# Patient Record
Sex: Male | Born: 1990 | Race: Black or African American | Hispanic: No | Marital: Single | Smoking: Never smoker
Health system: Southern US, Community
[De-identification: ages and names within clinical notes are randomized; demographics above are authoritative.]

## PROBLEM LIST (undated history)

## (undated) DIAGNOSIS — J45909 Unspecified asthma, uncomplicated: Secondary | ICD-10-CM

---

## 2014-09-22 ENCOUNTER — Ambulatory Visit: Payer: Self-pay

## 2015-08-16 ENCOUNTER — Emergency Department (HOSPITAL_COMMUNITY): Payer: Medicaid Other

## 2015-08-16 ENCOUNTER — Emergency Department (HOSPITAL_COMMUNITY)
Admission: EM | Admit: 2015-08-16 | Discharge: 2015-08-17 | Disposition: A | Discharge status: Home / Self Care | Payer: Medicaid Other | Attending: Emergency Medicine | Admitting: Emergency Medicine

## 2015-08-16 ENCOUNTER — Encounter (HOSPITAL_COMMUNITY): Payer: Self-pay | Admitting: *Deleted

## 2015-08-16 DIAGNOSIS — J45909 Unspecified asthma, uncomplicated: Secondary | ICD-10-CM | POA: Insufficient documentation

## 2015-08-16 DIAGNOSIS — M549 Dorsalgia, unspecified: Secondary | ICD-10-CM | POA: Diagnosis not present

## 2015-08-16 DIAGNOSIS — B029 Zoster without complications: Secondary | ICD-10-CM

## 2015-08-16 DIAGNOSIS — M25512 Pain in left shoulder: Secondary | ICD-10-CM | POA: Diagnosis not present

## 2015-08-16 DIAGNOSIS — R21 Rash and other nonspecific skin eruption: Secondary | ICD-10-CM | POA: Diagnosis present

## 2015-08-16 HISTORY — DX: Unspecified asthma, uncomplicated: J45.909

## 2015-08-16 MED ORDER — KETOROLAC TROMETHAMINE 60 MG/2ML IM SOLN
60.0000 mg | Freq: Once | INTRAMUSCULAR | Status: AC
  Administered 2015-08-16: 60 mg via INTRAMUSCULAR
  Filled 2015-08-16: qty 2

## 2015-08-16 MED ORDER — ACYCLOVIR 200 MG PO CAPS
800.0000 mg | ORAL_CAPSULE | Freq: Once | ORAL | Status: AC
  Administered 2015-08-16: 800 mg via ORAL
  Filled 2015-08-16: qty 4

## 2015-08-16 MED ORDER — HYDROCODONE-ACETAMINOPHEN 5-325 MG PO TABS
1.0000 | ORAL_TABLET | Freq: Once | ORAL | Status: AC
  Administered 2015-08-16: 1 via ORAL
  Filled 2015-08-16: qty 1

## 2015-08-16 MED ORDER — IBUPROFEN 600 MG PO TABS: 600.0000 mg | ORAL_TABLET | Freq: Four times a day (QID) | ORAL | Status: AC | PRN

## 2015-08-16 MED ORDER — ACYCLOVIR 800 MG PO TABS: 800.0000 mg | ORAL_TABLET | Freq: Every day | ORAL | Status: AC

## 2015-08-16 MED ORDER — HYDROCODONE-ACETAMINOPHEN 5-325 MG PO TABS: 2.0000 | ORAL_TABLET | ORAL | Status: AC | PRN

## 2015-08-16 NOTE — ED Notes (Signed)
Patient transported to X-ray 

## 2015-08-16 NOTE — ED Provider Notes (Signed)
CSN: [id number]     Arrival date & time 08/16/15  1740 History   First MD Initiated Contact with Patient 08/16/15 2214     Chief Complaint  Patient presents with  . Extremity Laceration     (Consider location/radiation/quality/duration/timing/severity/associated sxs/prior Treatment) HPI Patient presents with rash to the left upper chest and left upper back. This been gone for 3 days and then developed painful rash. No previously similar rash. No new exposures. States he had chickenpox as a child. Denies any shortness of breath, fever or chills. Patient does have some pain in his left hand. Describes the sensation as "bone feels cold". No contacts with similar illness. Past Medical History  Diagnosis Date  . Asthma    History reviewed. No pertinent past surgical history. No family history on file. Social History  Substance Use Topics  . Smoking status: Never Smoker   . Smokeless tobacco: None  . Alcohol Use: Yes    Review of Systems  Constitutional: Negative for fever and chills.  Eyes: Negative for visual disturbance.  Respiratory: Negative for shortness of breath.   Cardiovascular: Negative for chest pain, palpitations and leg swelling.  Gastrointestinal: Negative for nausea, vomiting, abdominal pain, diarrhea and constipation.  Musculoskeletal: Positive for back pain. Negative for neck pain and neck stiffness.  Skin: Positive for rash. Negative for wound.  Neurological: Negative for dizziness, weakness, light-headedness, numbness and headaches.  All other systems reviewed and are negative.     Allergies  Review of patient's allergies indicates no known allergies.  Home Medications   Prior to Admission medications   Medication Sig Start Date End Date Taking? Authorizing Provider  acyclovir (ZOVIRAX) 800 MG tablet Take 1 tablet (800 mg total) by mouth 5 (five) times daily. 08/16/15   Loren Raceravid Rogerick Baldwin, MD  HYDROcodone-acetaminophen (NORCO) 5-325 MG tablet Take 2 tablets  by mouth every 4 (four) hours as needed. 08/16/15   Loren Raceravid Hanan Moen, MD  ibuprofen (ADVIL,MOTRIN) 600 MG tablet Take 1 tablet (600 mg total) by mouth every 6 (six) hours as needed. 08/16/15   Loren Raceravid Taetum Flewellen, MD   BP 131/77 mmHg  Pulse 70  Temp(Src) 98 F (36.7 C)  Resp 18  Ht 5\' 9"  (1.753 m)  Wt 181 lb 1 oz (82.129 kg)  BMI 26.73 kg/m2  SpO2 100% Physical Exam  Constitutional: He is oriented to person, place, and time. He appears well-developed and well-nourished. No distress.  HENT:  Head: Normocephalic and atraumatic.  Mouth/Throat: Oropharynx is clear and moist. No oropharyngeal exudate.  Eyes: EOM are normal. Pupils are equal, round, and reactive to light.  Neck: Normal range of motion. Neck supple. No JVD present.  Cardiovascular: Normal rate and regular rhythm.  Exam reveals no gallop and no friction rub.   No murmur heard. Pulmonary/Chest: Effort normal and breath sounds normal. No stridor. No respiratory distress. He has no wheezes. He has no rales. He exhibits tenderness.  Patient does have mild tenderness in the middle of the left clavicle. There is bony prominence at this area.  Abdominal: Soft. Bowel sounds are normal. He exhibits no distension and no mass. There is no tenderness. There is no rebound and no guarding.  Musculoskeletal: Normal range of motion. He exhibits no edema or tenderness.  No midline thoracic or lumbar tenderness. Distal pulses intact.  Neurological: He is alert and oriented to person, place, and time.  5/5 motor in all extremities. Sensation fully intact.  Skin: Skin is warm and dry. Rash noted. There is erythema.  Patient  with edematous vesicular rash in band like pattern to the upper chest and left upper thoracic region not crossing midline. Tender to light palpation.   Psychiatric: He has a normal mood and affect. His behavior is normal.  Nursing note and vitals reviewed.   ED Course  Procedures (including critical care time) Labs  Review Labs Reviewed - No data to display  Imaging Review No results found. I have personally reviewed and evaluated these images and lab results as part of my medical decision-making.   EKG Interpretation None      MDM   Final diagnoses:  Herpes zoster  Clavicle pain, left    Patient with rash consistent zoster. We'll treat symptomatically and start on antivirals. Patient is well-appearing and in no distress. He has normal vital signs.    Loren Racer, MD 08/20/15 [id number]

## 2015-08-16 NOTE — Discharge Instructions (Signed)

## 2015-08-16 NOTE — ED Notes (Signed)
The pt is c/o lt hand pain  For 3 days  He is also c/o clavicle lt pain that he broke 2011 and he also has rash on his  Chest yesterday  That is just below the lt clavicle and he feels like it is on his posterior shoulder on the lt.   Pain  And itching.  The anterior chest appears to be a shingles rash

## 2015-12-26 ENCOUNTER — Encounter (HOSPITAL_BASED_OUTPATIENT_CLINIC_OR_DEPARTMENT_OTHER): Payer: Self-pay | Admitting: *Deleted

## 2015-12-26 ENCOUNTER — Emergency Department (HOSPITAL_BASED_OUTPATIENT_CLINIC_OR_DEPARTMENT_OTHER): Payer: Medicaid Other

## 2015-12-26 ENCOUNTER — Emergency Department (HOSPITAL_BASED_OUTPATIENT_CLINIC_OR_DEPARTMENT_OTHER)
Admission: EM | Admit: 2015-12-26 | Discharge: 2015-12-26 | Disposition: A | Discharge status: Home / Self Care | Payer: Medicaid Other | Attending: Emergency Medicine | Admitting: Emergency Medicine

## 2015-12-26 DIAGNOSIS — R05 Cough: Secondary | ICD-10-CM | POA: Diagnosis present

## 2015-12-26 DIAGNOSIS — J45909 Unspecified asthma, uncomplicated: Secondary | ICD-10-CM | POA: Diagnosis not present

## 2015-12-26 DIAGNOSIS — J209 Acute bronchitis, unspecified: Secondary | ICD-10-CM | POA: Insufficient documentation

## 2015-12-26 MED ORDER — ALBUTEROL SULFATE HFA 108 (90 BASE) MCG/ACT IN AERS
2.0000 | INHALATION_SPRAY | RESPIRATORY_TRACT | Status: DC | PRN
  Administered 2015-12-26: 2 via RESPIRATORY_TRACT
  Filled 2015-12-26: qty 6.7

## 2015-12-26 MED ORDER — AZITHROMYCIN 250 MG PO TABS: ORAL_TABLET | ORAL | Status: AC

## 2015-12-26 MED ORDER — PREDNISONE 10 MG PO TABS: 20.0000 mg | ORAL_TABLET | Freq: Two times a day (BID) | ORAL | Status: AC

## 2015-12-26 NOTE — ED Notes (Signed)
Cough x 2 months. Worse over the past 2 weeks. He ran out of his inhaler 2 months ago.

## 2015-12-26 NOTE — ED Provider Notes (Signed)
CSN: [id number]     Arrival date & time 12/26/15  1839 History  By signing my name below, I, Linus Galas, attest that this documentation has been prepared under the direction and in the presence of Geoffery Lyons, MD. Electronically Signed: Linus Galas, ED Scribe. 12/26/2015. 8:04 PM.   Chief Complaint  Patient presents with  . Cough   The history is provided by the patient. No language interpreter was used.   HPI Comments: Eric Jennings is a 25 y.o. male who presents to the Emergency Department with a PMHx of asthma complaining of an ongoing productive cough for 2 months that worsened over the past 2 weeks. Pt also reports diaphoresis. Pt states he ran out of his inhaler 2 months ago. Pt denies any fevers, chills, CP, N/V/D or any other symptoms at this time. Pt does not smoke cigarettes.  Past Medical History  Diagnosis Date  . Asthma    History reviewed. No pertinent past surgical history. No family history on file. Social History  Substance Use Topics  . Smoking status: Never Smoker   . Smokeless tobacco: None  . Alcohol Use: Yes    Review of Systems A complete 10 system review of systems was obtained and all systems are negative except as noted in the HPI and PMH.   Allergies  Review of patient's allergies indicates no known allergies.  Home Medications   Prior to Admission medications   Medication Sig Start Date End Date Taking? Authorizing Provider  acyclovir (ZOVIRAX) 800 MG tablet Take 1 tablet (800 mg total) by mouth 5 (five) times daily. 08/16/15   Loren Racer, MD  HYDROcodone-acetaminophen (NORCO) 5-325 MG tablet Take 2 tablets by mouth every 4 (four) hours as needed. 08/16/15   Loren Racer, MD  ibuprofen (ADVIL,MOTRIN) 600 MG tablet Take 1 tablet (600 mg total) by mouth every 6 (six) hours as needed. 08/16/15   Loren Racer, MD   BP 107/91 mmHg  Pulse 94  Temp(Src) 98.4 F (36.9 C) (Oral)  Resp 20  Ht  (1.778 m)  Wt 167 lb (75.751  kg)  BMI 23.96 kg/m2  SpO2 98%   Physical Exam  Constitutional: He is oriented to person, place, and time. He appears well-developed and well-nourished. No distress.  HENT:  Head: Normocephalic and atraumatic.  Mouth/Throat: Oropharynx is clear and moist. No oropharyngeal exudate.  Eyes: Conjunctivae and EOM are normal. Pupils are equal, round, and reactive to light.  Neck: Normal range of motion. Neck supple.  No meningismus.  Cardiovascular: Normal rate, regular rhythm, normal heart sounds and intact distal pulses.   No murmur heard. Pulmonary/Chest: Effort normal and breath sounds normal. No respiratory distress.  Abdominal: Soft. There is no tenderness. There is no rebound and no guarding.  Musculoskeletal: Normal range of motion. He exhibits no edema or tenderness.  Neurological: He is alert and oriented to person, place, and time. No cranial nerve deficit. He exhibits normal muscle tone. Coordination normal.  No ataxia on finger to nose bilaterally. No pronator drift. 5/5 strength throughout. CN 2-12 intact.Equal grip strength. Sensation intact.   Skin: Skin is warm.  Psychiatric: He has a normal mood and affect. His behavior is normal.  Nursing note and vitals reviewed.    ED Course  Procedures  DIAGNOSTIC STUDIES: Oxygen Saturation is 98% on room air, normal by my interpretation.    COORDINATION OF CARE: 8:01 PM Discussed treatment plan with pt at bedside and pt agreed to plan.  Labs Review Labs Reviewed -  No data to display  Imaging Review No results found. I have personally reviewed and evaluated these images and lab results as part of my medical decision-making.   EKG Interpretation None      MDM   Final diagnoses:  None    Patient with worsening cough for the past 2 weeks. He has been out of his inhaler first several months. He will be treated for bronchitis with Zithromax, prednisone, and will be given an albuterol inhaler.  I personally performed the  services described in this documentation, which was scribed in my presence. The recorded information has been reviewed and is accurate.       Geoffery Lyonsouglas Geordie Nooney, MD 12/26/15 [phone]

## 2015-12-26 NOTE — Discharge Instructions (Signed)
Zithromax and prednisone as prescribed.  Albuterol inhaler: 2 puffs every 4 hours as needed for wheezing.  Over-the-counter cough and cold medication as needed for symptomatic relief.  Return to the emergency department if symptoms significant only worsen or change.   Acute Bronchitis Bronchitis is inflammation of the airways that extend from the windpipe into the lungs (bronchi). The inflammation often causes mucus to develop. This leads to a cough, which is the most common symptom of bronchitis.  In acute bronchitis, the condition usually develops suddenly and goes away over time, usually in a couple weeks. Smoking, allergies, and asthma can make bronchitis worse. Repeated episodes of bronchitis may cause further lung problems.  CAUSES Acute bronchitis is most often caused by the same virus that causes a cold. The virus can spread from person to person (contagious) through coughing, sneezing, and touching contaminated objects. SIGNS AND SYMPTOMS   Cough.   Fever.   Coughing up mucus.   Body aches.   Chest congestion.   Chills.   Shortness of breath.   Sore throat.  DIAGNOSIS  Acute bronchitis is usually diagnosed through a physical exam. Your health care provider will also ask you questions about your medical history. Tests, such as chest X-rays, are sometimes done to rule out other conditions.  TREATMENT  Acute bronchitis usually goes away in a couple weeks. Oftentimes, no medical treatment is necessary. Medicines are sometimes given for relief of fever or cough. Antibiotic medicines are usually not needed but may be prescribed in certain situations. In some cases, an inhaler may be recommended to help reduce shortness of breath and control the cough. A cool mist vaporizer may also be used to help thin bronchial secretions and make it easier to clear the chest.  HOME CARE INSTRUCTIONS  Get plenty of rest.   Drink enough fluids to keep your urine clear or pale yellow  (unless you have a medical condition that requires fluid restriction). Increasing fluids may help thin your respiratory secretions (sputum) and reduce chest congestion, and it will prevent dehydration.   Take medicines only as directed by your health care provider.  If you were prescribed an antibiotic medicine, finish it all even if you start to feel better.  Avoid smoking and secondhand smoke. Exposure to cigarette smoke or irritating chemicals will make bronchitis worse. If you are a smoker, consider using nicotine gum or skin patches to help control withdrawal symptoms. Quitting smoking will help your lungs heal faster.   Reduce the chances of another bout of acute bronchitis by washing your hands frequently, avoiding people with cold symptoms, and trying not to touch your hands to your mouth, nose, or eyes.   Keep all follow-up visits as directed by your health care provider.  SEEK MEDICAL CARE IF: Your symptoms do not improve after 1 week of treatment.  SEEK IMMEDIATE MEDICAL CARE IF:  You develop an increased fever or chills.   You have chest pain.   You have severe shortness of breath.  You have bloody sputum.   You develop dehydration.  You faint or repeatedly feel like you are going to pass out.  You develop repeated vomiting.  You develop a severe headache. MAKE SURE YOU:   Understand these instructions.  Will watch your condition.  Will get help right away if you are not doing well or get worse.   This information is not intended to replace advice given to you by your health care provider. Make sure you discuss any questions you  have with your health care provider.   Document Released: 09/13/2004 Document Revised: 08/27/2014 Document Reviewed: 01/27/2013 Elsevier Interactive Patient Education [employer].

## 2017-04-22 IMAGING — CR DG CHEST 2V
2 series · 2 of 2 positions shown · non-contrast
Comparison: None.

CLINICAL DATA: Chest pain

EXAM:
CHEST - 2 VIEW

[chest pa]
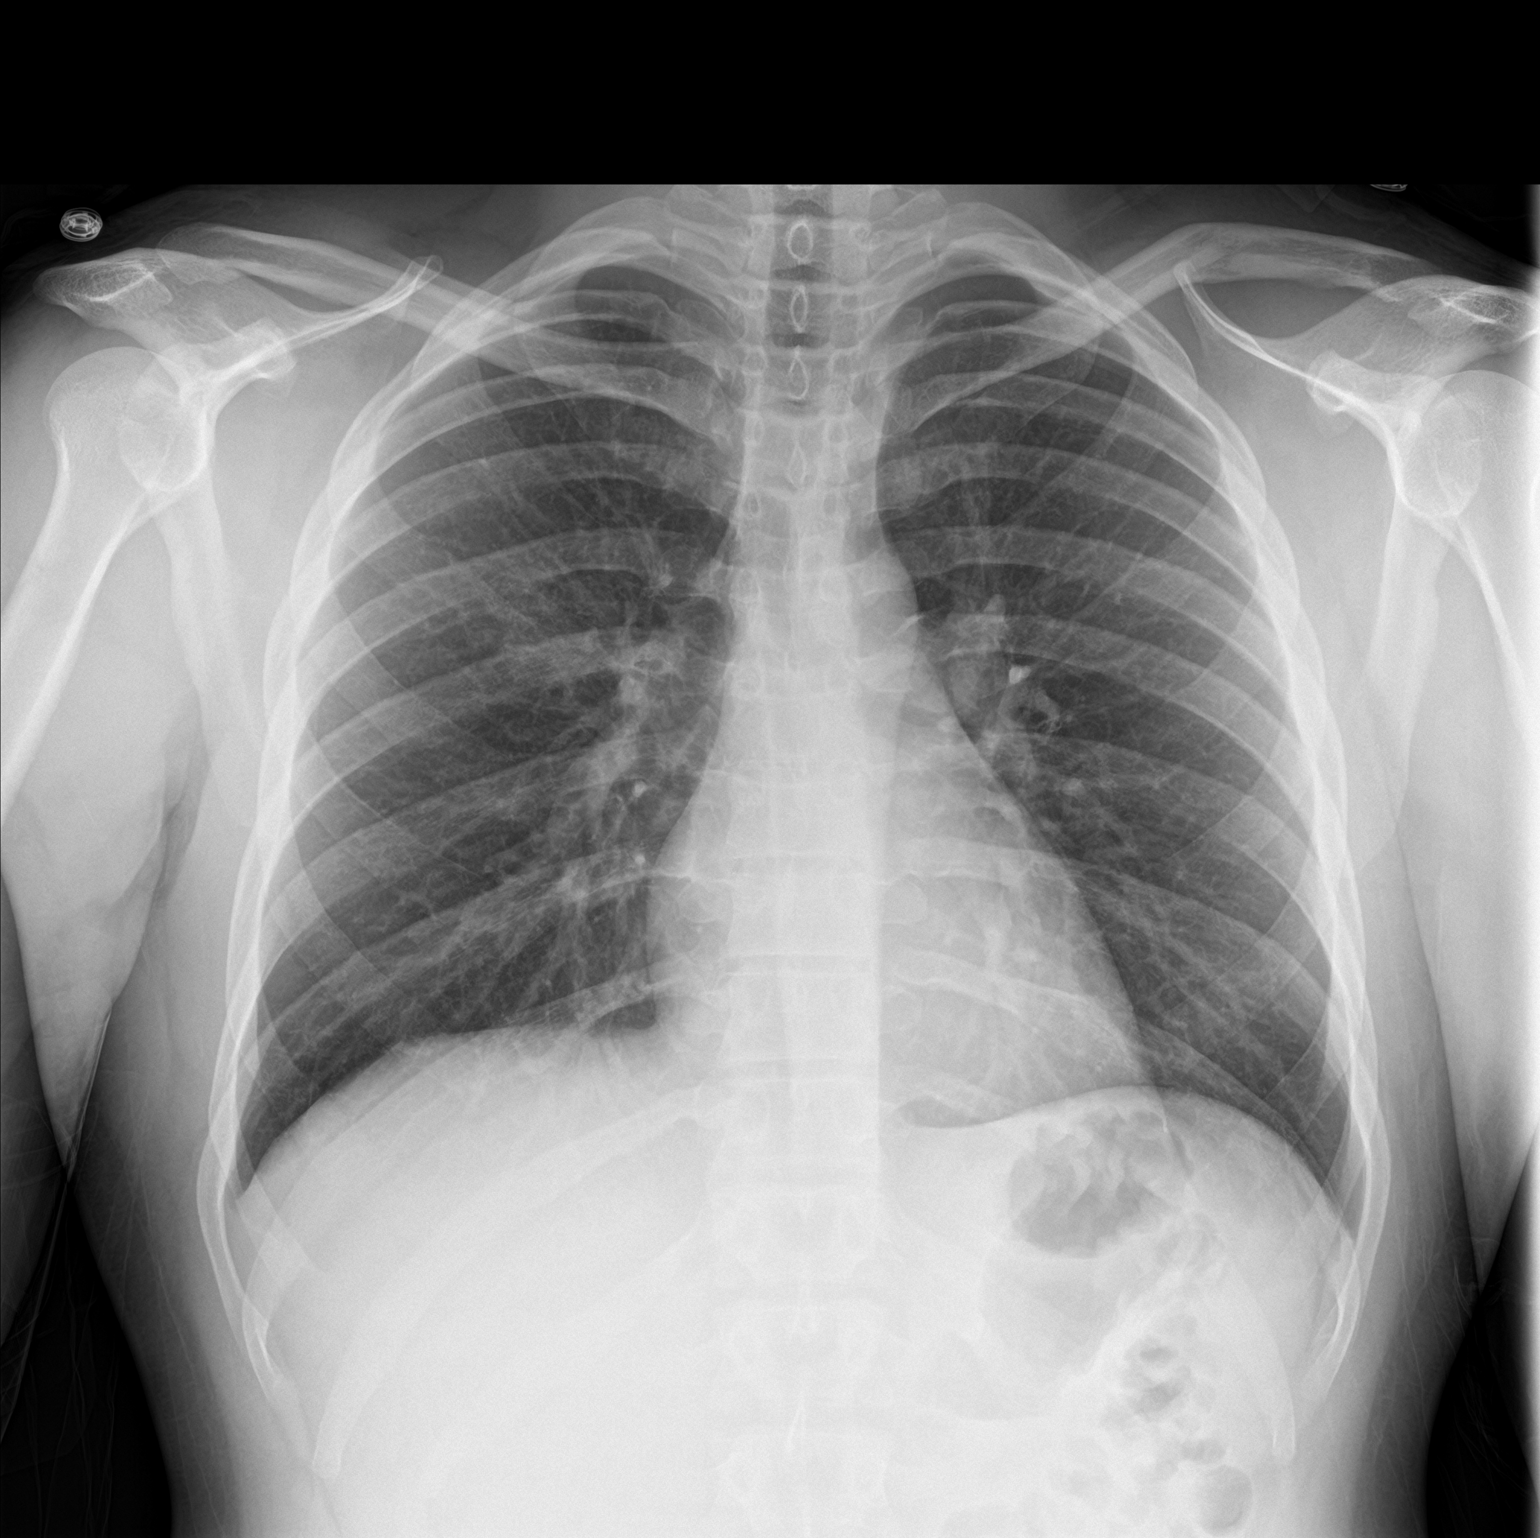

[chest lat]
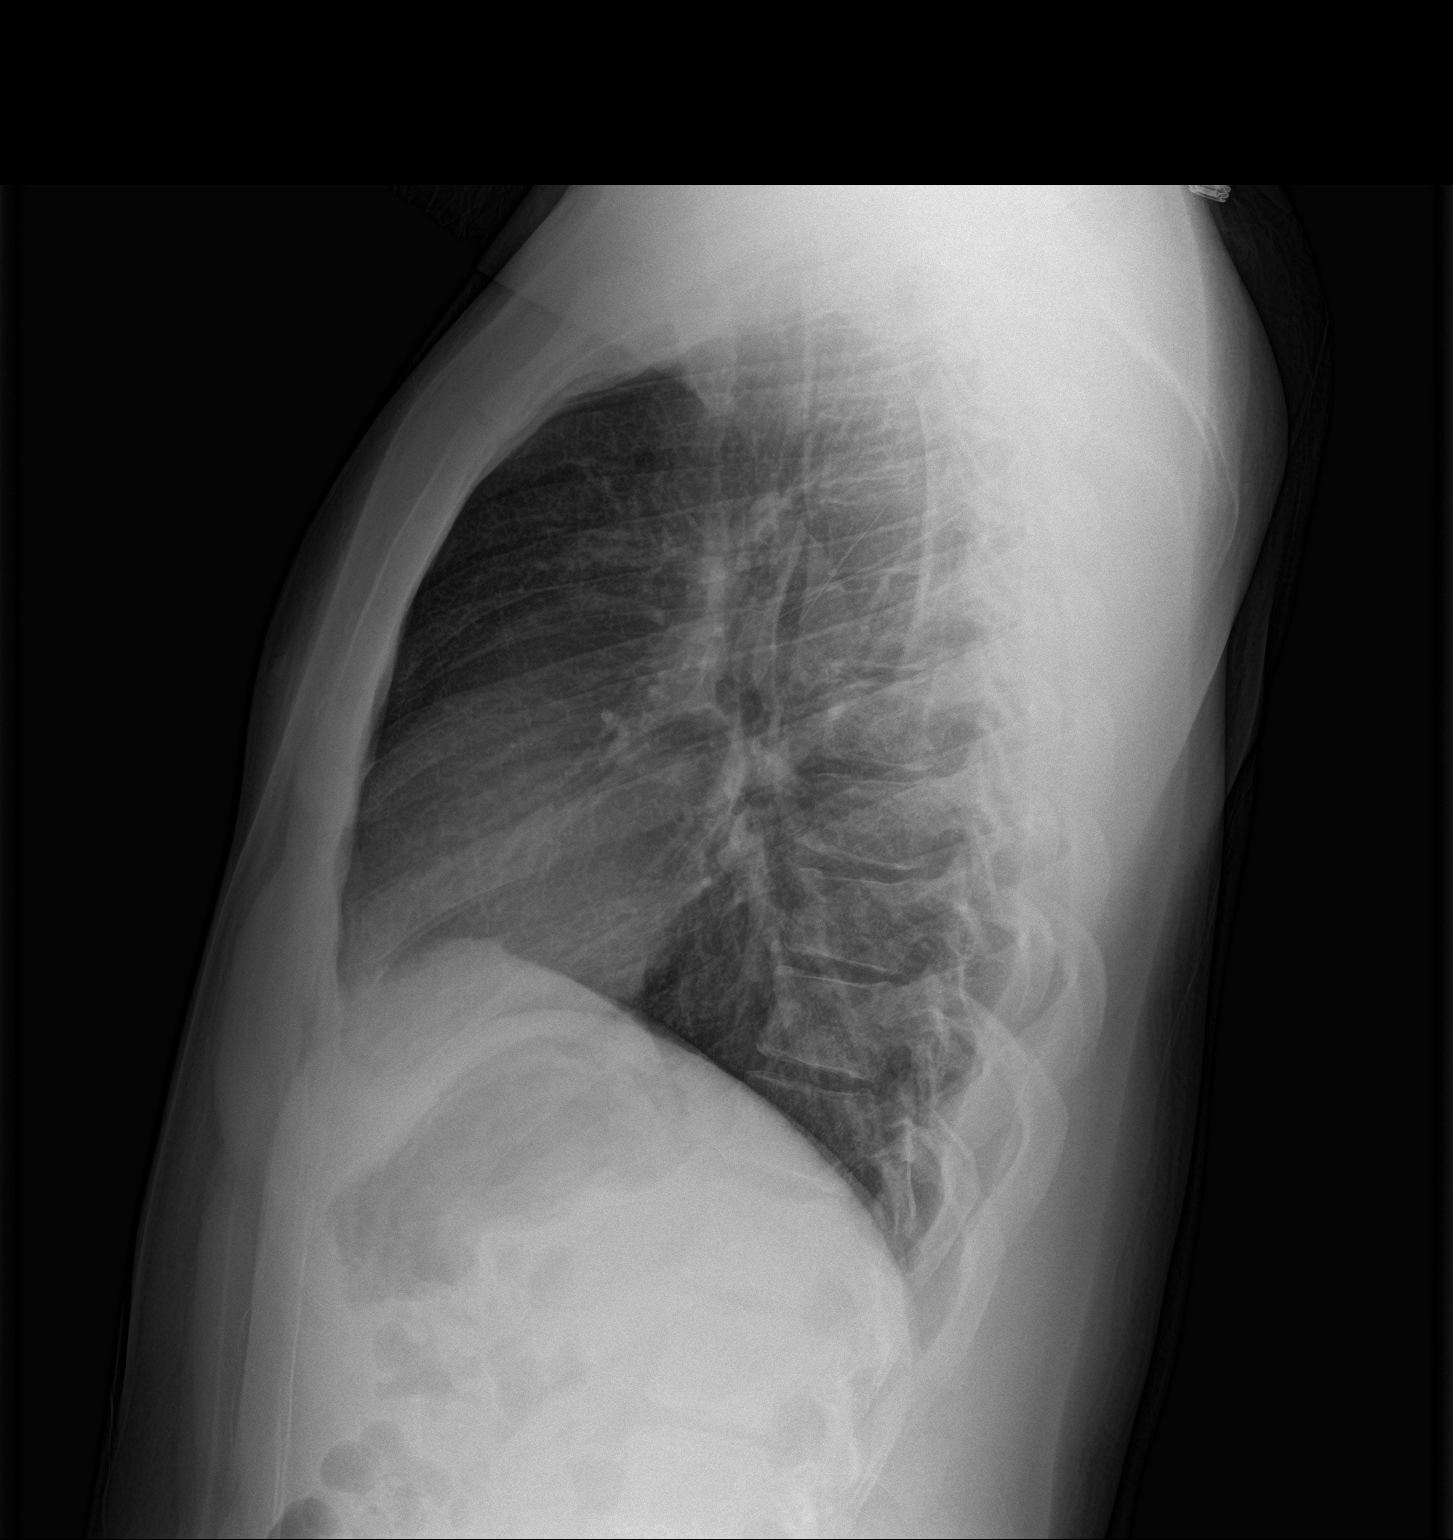

[2 of 2 positions shown; findings below may reference images not displayed]

FINDINGS: Cardiac shadow is within normal limits. The lungs are clear
bilaterally. Bony structures demonstrate angulation of the midshaft
of the left clavicle. This may be related to prior fracture with
healing. Clinical correlation is recommended. No other bony
abnormality is seen.
IMPRESSION: Deformity of the left clavicle which may be related to a healed
fracture. Clinical correlation is recommended. Correlation to point
tenderness is recommended as well.
